# Patient Record
Sex: Male | Born: 1985 | Race: White | Hispanic: No | Marital: Single | Smoking: Former smoker
Health system: Southern US, Community
[De-identification: ages and names within clinical notes are randomized; demographics above are authoritative.]

## PROBLEM LIST (undated history)

## (undated) DIAGNOSIS — K219 Gastro-esophageal reflux disease without esophagitis: Secondary | ICD-10-CM

## (undated) DIAGNOSIS — J45909 Unspecified asthma, uncomplicated: Secondary | ICD-10-CM

## (undated) DIAGNOSIS — I10 Essential (primary) hypertension: Secondary | ICD-10-CM

## (undated) DIAGNOSIS — K921 Melena: Secondary | ICD-10-CM

## (undated) HISTORY — DX: Essential (primary) hypertension: I10

## (undated) HISTORY — DX: Gastro-esophageal reflux disease without esophagitis: K21.9

## (undated) HISTORY — DX: Unspecified asthma, uncomplicated: J45.909

## (undated) HISTORY — PX: TONSILLECTOMY AND ADENOIDECTOMY: SUR1326

## (undated) HISTORY — DX: Melena: K92.1

---

## 2005-06-10 ENCOUNTER — Emergency Department: Payer: Self-pay | Admitting: Emergency Medicine

## 2009-11-10 ENCOUNTER — Emergency Department (HOSPITAL_COMMUNITY): Admission: EM | Admit: 2009-11-10 | Discharge: 2009-11-10 | Payer: Self-pay | Admitting: Emergency Medicine

## 2010-05-09 LAB — COMPREHENSIVE METABOLIC PANEL
ALT: 21 U/L (ref 0–53)
Alkaline Phosphatase: 58 U/L (ref 39–117)
CO2: 26 mEq/L (ref 19–32)
Calcium: 9 mg/dL (ref 8.4–10.5)
Chloride: 106 mEq/L (ref 96–112)
GFR calc non Af Amer: >60 mL/min (ref >60)
Glucose, Bld: 100 mg/dL — ABNORMAL HIGH (ref 70–99)
Sodium: 138 mEq/L (ref 135–145)
Total Bilirubin: 0.7 mg/dL (ref 0.3–1.2)

## 2010-05-09 LAB — URINALYSIS, ROUTINE W REFLEX MICROSCOPIC
Ketones, ur: NEGATIVE mg/dL
Protein, ur: NEGATIVE mg/dL
Urobilinogen, UA: 0.2 mg/dL (ref 0.0–1.0)

## 2010-05-09 LAB — CBC
HCT: 43.9 % (ref 39.0–52.0)
Hemoglobin: 15.7 g/dL (ref 13.0–17.0)
MCHC: 35.8 g/dL (ref 30.0–36.0)
RBC: 5.01 MIL/uL (ref 4.22–5.81)

## 2010-05-09 LAB — CARBOXYHEMOGLOBIN: Total hemoglobin: 14.4 g/dL (ref 13.5–18.0)

## 2010-05-09 LAB — DIFFERENTIAL
Basophils Absolute: 0 10*3/uL (ref 0.0–0.1)
Basophils Relative: 1 % (ref 0–1)
Eosinophils Absolute: 0.1 10*3/uL (ref 0.0–0.7)
Lymphs Abs: 1.9 10*3/uL (ref 0.7–4.0)
Neutrophils Relative %: 54 % (ref 43–77)

## 2012-09-25 ENCOUNTER — Emergency Department: Payer: Self-pay | Admitting: Emergency Medicine

## 2012-09-25 LAB — COMPREHENSIVE METABOLIC PANEL
Alkaline Phosphatase: 67 U/L (ref 50–136)
EGFR (African American): >60
Glucose: 93 mg/dL (ref 65–99)
SGOT(AST): 23 U/L (ref 15–37)
SGPT (ALT): 33 U/L (ref 12–78)
Sodium: 139 mmol/L (ref 136–145)

## 2012-09-25 LAB — LIPASE, BLOOD: Lipase: 174 U/L (ref 73–393)

## 2012-09-25 LAB — URINALYSIS, COMPLETE
Nitrite: NEGATIVE
RBC,UR: 348 /HPF (ref 0–5)
Squamous Epithelial: NONE SEEN

## 2012-09-25 LAB — SEDIMENTATION RATE: Erythrocyte Sed Rate: 2 mm/hr (ref 0–15)

## 2013-05-04 ENCOUNTER — Encounter: Payer: Self-pay | Admitting: Family

## 2013-05-04 ENCOUNTER — Ambulatory Visit (INDEPENDENT_AMBULATORY_CARE_PROVIDER_SITE_OTHER): Payer: BC Managed Care – PPO | Admitting: Family

## 2013-05-04 VITALS — BP 122/80 | HR 69 | Ht 70.0 in | Wt 211.0 lb

## 2013-05-04 DIAGNOSIS — K920 Hematemesis: Secondary | ICD-10-CM

## 2013-05-04 DIAGNOSIS — K219 Gastro-esophageal reflux disease without esophagitis: Secondary | ICD-10-CM

## 2013-05-04 DIAGNOSIS — K625 Hemorrhage of anus and rectum: Secondary | ICD-10-CM

## 2013-05-04 LAB — CBC WITH DIFFERENTIAL/PLATELET
BASOS PCT: 0.4 % (ref 0.0–3.0)
Basophils Absolute: 0 10*3/uL (ref 0.0–0.1)
EOS ABS: 0.5 10*3/uL (ref 0.0–0.7)
Eosinophils Relative: 6 % — ABNORMAL HIGH (ref 0.0–5.0)
HCT: 44.2 % (ref 39.0–52.0)
Hemoglobin: 15.4 g/dL (ref 13.0–17.0)
LYMPHS PCT: 26.2 % (ref 12.0–46.0)
Lymphs Abs: 2 10*3/uL (ref 0.7–4.0)
MCHC: 34.8 g/dL (ref 30.0–36.0)
MCV: 90.5 fl (ref 78.0–100.0)
MONOS PCT: 8.3 % (ref 3.0–12.0)
Monocytes Absolute: 0.6 10*3/uL (ref 0.1–1.0)
NEUTROS PCT: 59.1 % (ref 43.0–77.0)
Neutro Abs: 4.6 10*3/uL (ref 1.4–7.7)
Platelets: 223 10*3/uL (ref 150.0–400.0)
RBC: 4.89 Mil/uL (ref 4.22–5.81)
RDW: 12.9 % (ref 11.5–14.6)
WBC: 7.8 10*3/uL (ref 4.5–10.5)

## 2013-05-04 LAB — COMPREHENSIVE METABOLIC PANEL
ALBUMIN: 4.4 g/dL (ref 3.5–5.2)
ALT: 33 U/L (ref 0–53)
AST: 21 U/L (ref 0–37)
Alkaline Phosphatase: 54 U/L (ref 39–117)
BUN: 13 mg/dL (ref 6–23)
CALCIUM: 9.3 mg/dL (ref 8.4–10.5)
CHLORIDE: 104 meq/L (ref 96–112)
CO2: 28 mEq/L (ref 19–32)
Creatinine, Ser: 1.1 mg/dL (ref 0.4–1.5)
GFR: 81.66 mL/min (ref >60.00)
Glucose, Bld: 110 mg/dL — ABNORMAL HIGH (ref 70–99)
POTASSIUM: 3.8 meq/L (ref 3.5–5.1)
Sodium: 138 mEq/L (ref 135–145)
Total Bilirubin: 0.7 mg/dL (ref 0.3–1.2)
Total Protein: 7 g/dL (ref 6.0–8.3)

## 2013-05-04 LAB — H. PYLORI ANTIBODY, IGG: H Pylori IgG: NEGATIVE

## 2013-05-04 MED ORDER — ESOMEPRAZOLE MAGNESIUM 40 MG PO CPDR: 40.0000 mg | DELAYED_RELEASE_CAPSULE | Freq: Every day | ORAL | Status: AC

## 2013-05-04 NOTE — Progress Notes (Signed)
   Subjective:    Patient ID: Colton Soto, male    DOB: 1985-05-19, 28 y.o.   MRN: [id number]  HPI 28 year old white male, nonsmoker, is in today with complaints of dark black blood in his stools and vomiting blood and 3-4 times per week over the last 3 years. Reports a significant family history of GERD as well as personal history. He has taken Prilosec over-the-counter and has not helped his symptoms. Reports heartburn and indigestion but denies any abdominal pain. Denies any family history of any colon cancer.   Review of Systems  Constitutional: Negative.   HENT: Negative.   Respiratory: Negative.   Cardiovascular: Negative.   Gastrointestinal: Positive for nausea, vomiting and blood in stool.  Endocrine: Negative.   Genitourinary: Negative.   Musculoskeletal: Negative.   Skin: Negative.   Neurological: Negative.   Psychiatric/Behavioral: Negative.    Past Medical History  Diagnosis Date  . Asthma   . GERD (gastroesophageal reflux disease)   . Hypertension   . Blood in stool     History   Social History  . Marital Status: Single    Spouse Name: N/A    Number of Children: N/A  . Years of Education: N/A   Occupational History  . Not on file.   Social History Main Topics  . Smoking status: Former Smoker    Quit date: 02/03/2013  . Smokeless tobacco: Not on file  . Alcohol Use: Yes  . Drug Use: Yes    Special: Marijuana  . Sexual Activity: Not on file   Other Topics Concern  . Not on file   Social History Narrative  . No narrative on file    Past Surgical History  Procedure Laterality Date  . Tonsillectomy and adenoidectomy      Family History  Problem Relation Age of Onset  . Hypertension Mother   . Hyperlipidemia Father   . Hypertension Father   . Lung cancer Paternal Grandmother     Allergies  Allergen Reactions  . Codeine     No current outpatient prescriptions on file prior to visit.   No current facility-administered medications on  file prior to visit.    BP 122/80  Pulse 69  Ht 5\' 10"  (1.778 m)  Wt 211 lb (95.709 kg)  BMI 30.28 kg/m2chart    Objective:   Physical Exam  Constitutional: He is oriented to person, place, and time. He appears well-developed and well-nourished.  HENT:  Right Ear: External ear normal.  Left Ear: External ear normal.  Nose: Nose normal.  Mouth/Throat: Oropharynx is clear and moist.  Neck: Normal range of motion. Neck supple.  Cardiovascular: Normal rate, regular rhythm and normal heart sounds.   Pulmonary/Chest: Effort normal and breath sounds normal.  Abdominal: Soft. Bowel sounds are normal.  Genitourinary: Guaiac negative stool.  Neurological: He is alert and oriented to person, place, and time.  Skin: Skin is warm and dry.  Psychiatric: He has a normal mood and affect.          Assessment & Plan:  Ivin BootyJoshua was seen today for establish care, rectal bleeding and emesis.  Diagnoses and associated orders for this visit:  Rectal bleeding - CBC with Differential - CMP - H. pylori Antibody, IgG - Ambulatory referral to Gastroenterology  Hematemesis - CBC with Differential - CMP - H. pylori Antibody, IgG - Ambulatory referral to Gastroenterology  Other Orders - esomeprazole (NEXIUM) 40 MG capsule; Take 1 capsule (40 mg total) by mouth daily.

## 2013-05-04 NOTE — Patient Instructions (Signed)
Rectal Bleeding Rectal bleeding is when blood passes out of the anus. It is usually a sign that something is wrong. It may not be serious, but it should always be evaluated. Rectal bleeding may present as bright red blood or extremely dark stools. The color may range from dark red or maroon to black (like tar). It is important that the cause of rectal bleeding be identified so treatment can be started and the problem corrected. CAUSES   Hemorrhoids. These are enlarged (dilated) blood vessels or veins in the anal or rectal area.  Fistulas. Theseare abnormal, burrowing channels that usually run from inside the rectum to the skin around the anus. They can bleed.  Anal fissures. This is a tear in the tissue of the anus. Bleeding occurs with bowel movements.  Diverticulosis. This is a condition in which pockets or sacs project from the bowel wall. Occasionally, the sacs can bleed.  Diverticulitis. Thisis an infection involving diverticulosis of the colon.  Proctitis and colitis. These are conditions in which the rectum, colon, or both, can become inflamed and pitted (ulcerated).  Polyps and cancer. Polyps are non-cancerous (benign) growths in the colon that may bleed. Certain types of polyps turn into cancer.  Protrusion of the rectum. Part of the rectum can project from the anus and bleed.  Certain medicines.  Intestinal infections.  Blood vessel abnormalities. HOME CARE INSTRUCTIONS  Eat a high-fiber diet to keep your stool soft.  Limit activity.  Drink enough fluids to keep your urine clear or pale yellow.  Warm baths may be useful to soothe rectal pain.  Follow up with your caregiver as directed. SEEK IMMEDIATE MEDICAL CARE IF:  You develop increased bleeding.  You have black or dark red stools.  You vomit blood or material that looks like coffee grounds.  You have abdominal pain or tenderness.  You have a fever.  You feel weak, nauseous, or you faint.  You have  severe rectal pain or you are unable to have a bowel movement. MAKE SURE YOU:  Understand these instructions.  Will watch your condition.  Will get help right away if you are not doing well or get worse. Document Released: 08/02/2001 Document Revised: 05/05/2011 Document Reviewed: 07/28/2010 [facility] Patient Information 2014 [place], [place].   Diet for Gastroesophageal Reflux Disease, Adult Reflux (acid reflux) is when acid from your stomach flows up into the esophagus. When acid comes in contact with the esophagus, the acid causes irritation and soreness (inflammation) in the esophagus. When reflux happens often or so severely that it causes damage to the esophagus, it is called gastroesophageal reflux disease (GERD). Nutrition therapy can help ease the discomfort of GERD. FOODS OR DRINKS TO AVOID OR LIMIT  Smoking or chewing tobacco. Nicotine is one of the most potent stimulants to acid production in the gastrointestinal tract.  Caffeinated and decaffeinated coffee and black tea.  Regular or low-calorie carbonated beverages or energy drinks (caffeine-free carbonated beverages are allowed).   Strong spices, such as black pepper, white pepper, red pepper, cayenne, curry powder, and chili powder.  Peppermint or spearmint.  Chocolate.  High-fat foods, including meats and fried foods. Extra added fats including oils, butter, salad dressings, and nuts. Limit these to less than 8 tsp per day.  Fruits and vegetables if they are not tolerated, such as citrus fruits or tomatoes.  Alcohol.  Any food that seems to aggravate your condition. If you have questions regarding your diet, call your caregiver or a registered dietitian. OTHER THINGS THAT  MAY HELP GERD INCLUDE:   Eating your meals slowly, in a relaxed setting.  Eating 5 to 6 small meals per day instead of 3 large meals.  Eliminating food for a period of time if it causes distress.  Not lying down until 3 hours after eating a  meal.  Keeping the head of your bed raised 6 to 9 inches (15 to 23 cm) by using a foam wedge or blocks under the legs of the bed. Lying flat may make symptoms worse.  Being physically active. Weight loss may be helpful in reducing reflux in overweight or obese adults.  Wear loose fitting clothing EXAMPLE MEAL PLAN This meal plan is approximately 2,000 calories based on https://www.bernard.org/ meal planning guidelines. Breakfast   cup cooked oatmeal.  1 cup strawberries.  1 cup low-fat milk.  1 oz almonds. Snack  1 cup cucumber slices.  6 oz yogurt (made from low-fat or fat-free milk). Lunch  2 slice whole-wheat bread.  2 oz sliced [place].  2 tsp mayonnaise.  1 cup blueberries.  1 cup snap peas. Snack  6 whole-wheat crackers.  1 oz string cheese. Dinner   cup brown rice.  1 cup mixed veggies.  1 tsp olive oil.  3 oz grilled fish. Document Released: 02/10/2005 Document Revised: 05/05/2011 Document Reviewed: 12/27/2010 [facility] Patient Information 2014 [place], [place].

## 2013-05-04 NOTE — Progress Notes (Signed)
Pre visit review using our clinic review tool, if applicable. No additional management support is needed unless otherwise documented below in the visit note. 

## 2013-06-06 ENCOUNTER — Encounter: Payer: Self-pay | Admitting: Gastroenterology

## 2013-06-11 ENCOUNTER — Emergency Department (HOSPITAL_COMMUNITY)
Admission: EM | Admit: 2013-06-11 | Discharge: 2013-06-11 | Disposition: A | Discharge status: Home / Self Care | Payer: BC Managed Care – PPO | Attending: Emergency Medicine | Admitting: Emergency Medicine

## 2013-06-11 ENCOUNTER — Encounter (HOSPITAL_COMMUNITY): Payer: Self-pay | Admitting: Emergency Medicine

## 2013-06-11 DIAGNOSIS — Z87891 Personal history of nicotine dependence: Secondary | ICD-10-CM | POA: Insufficient documentation

## 2013-06-11 DIAGNOSIS — Y929 Unspecified place or not applicable: Secondary | ICD-10-CM | POA: Insufficient documentation

## 2013-06-11 DIAGNOSIS — S01501A Unspecified open wound of lip, initial encounter: Secondary | ICD-10-CM | POA: Insufficient documentation

## 2013-06-11 DIAGNOSIS — J45909 Unspecified asthma, uncomplicated: Secondary | ICD-10-CM | POA: Insufficient documentation

## 2013-06-11 DIAGNOSIS — S01511A Laceration without foreign body of lip, initial encounter: Secondary | ICD-10-CM

## 2013-06-11 DIAGNOSIS — Z79899 Other long term (current) drug therapy: Secondary | ICD-10-CM | POA: Insufficient documentation

## 2013-06-11 DIAGNOSIS — K219 Gastro-esophageal reflux disease without esophagitis: Secondary | ICD-10-CM | POA: Insufficient documentation

## 2013-06-11 DIAGNOSIS — IMO0002 Reserved for concepts with insufficient information to code with codable children: Secondary | ICD-10-CM | POA: Insufficient documentation

## 2013-06-11 DIAGNOSIS — Y9389 Activity, other specified: Secondary | ICD-10-CM | POA: Insufficient documentation

## 2013-06-11 DIAGNOSIS — I10 Essential (primary) hypertension: Secondary | ICD-10-CM | POA: Insufficient documentation

## 2013-06-11 NOTE — ED Provider Notes (Signed)
CSN: [id number]     Arrival date & time 06/11/13  2034 History  This chart was scribed for non-physician practitioner Antony MaduraKelly Mabeline Varas, PA-C working with Shelda JakesScott W. Zackowski, MD by Joaquin MusicKristina Sanchez-Matthews, ED Scribe. This patient was seen in room WTR8/WTR8 and the patient's care was started at 10:34 PM .    Chief Complaint  Patient presents with  . Lip Laceration   Patient is a 28 y.o. male presenting with skin laceration. The history is provided by the patient. No language interpreter was used.  Laceration Location:  Mouth Mouth laceration location:  Upper outer lip Length (cm):  1.5 Depth:  Through dermis Quality: jagged   Bleeding: arterial   Injury mechanism: Wooden 2 x 4. Pain details:    Quality:  Dull   Severity:  No pain   Progression:  Unchanged Foreign body present:  No foreign bodies Relieved by:  Nothing Worsened by:  Nothing tried Ineffective treatments: Neosporin. Tetanus status:  Up to date  HPI Comments: Colton Soto is a 28 y.o. male who presents to the Emergency Department complaining of laceration to L upper lip that occurred 3 hours ago. Pt states he was hit by a 2 x 4 wood while unloading a truck. He denies having pain at this moment but states when moving or touching lip, pain worsens. He states his last tetanus shot was 2 years ago. Pts family member reports applying Neosporin to area without relief. Pt denies LOC, problems swallowing or loose teeth.  Past Medical History  Diagnosis Date  . Asthma   . GERD (gastroesophageal reflux disease)   . Hypertension   . Blood in stool    Past Surgical History  Procedure Laterality Date  . Tonsillectomy and adenoidectomy     Family History  Problem Relation Age of Onset  . Hypertension Mother   . Hyperlipidemia Father   . Hypertension Father   . Lung cancer Paternal Grandmother    History  Substance Use Topics  . Smoking status: Former Smoker    Quit date: 02/03/2013  . Smokeless tobacco: Not on file  .  Alcohol Use: Yes     Comment: occ    Review of Systems  HENT: Negative for dental problem and trouble swallowing.   Skin: Positive for wound.  Neurological: Negative for syncope.  All other systems reviewed and are negative.  Allergies  Codeine  Home Medications   Prior to Admission medications   Medication Sig Start Date End Date Taking? Authorizing Provider  esomeprazole (NEXIUM) 40 MG capsule Take 1 capsule (40 mg total) by mouth daily. 05/04/13   Baker PieriniPadonda B Campbell, FNP   BP 136/96  Pulse 93  Temp(Src) 99.2 F (37.3 C) (Oral)  Resp 16  Ht 5\' 11"  (1.803 m)  Wt 215 lb (97.523 kg)  BMI 30.00 kg/m2  SpO2 98%  Physical Exam  Nursing note and vitals reviewed. Constitutional: He is oriented to person, place, and time. He appears well-developed and well-nourished. No distress.  HENT:  Head: Normocephalic and atraumatic.  Mouth/Throat: Oropharynx is clear and moist. No oropharyngeal exudate.  1.5 cm laceration to upper outer lip; jagged with part paralleling/partially including vermilion border. 0.5cm laceration to L upper inner lip. Bleeding controlled. No loose dentition or evidence of dental trauma. Oropharynx clear. Patient tolerating secretions without difficulty or drooling.  Eyes: Conjunctivae and EOM are normal. No scleral icterus.  Neck: Normal range of motion.  Pulmonary/Chest: Effort normal. No respiratory distress.  Musculoskeletal: Normal range of motion.  Neurological: He  is alert and oriented to person, place, and time.  Skin: Skin is warm and dry. No rash noted. He is not diaphoretic. No erythema. No pallor.  Psychiatric: He has a normal mood and affect. His behavior is normal.    ED Course  Procedures  DIAGNOSTIC STUDIES: Oxygen Saturation is 98% on RA, normal by my interpretation.    COORDINATION OF CARE: 10:36 PM-Discussed treatment plan which includes laceration repair. Pt agreed to plan.   10:40 PM-LACERATION REPAIR Performed by: Antony MaduraKelly Minahil Quinlivan,  PA-C Consent: Verbal consent obtained. Risks and benefits: risks, benefits and alternatives were discussed Patient identity confirmed: provided demographic data Time out performed prior to procedure Prepped and Draped in normal sterile fashion Wound explored Laceration Location: L upper outer lip Laceration Length: 1.5 cm No Foreign Bodies seen or palpated Anesthesia: local infiltration Local anesthetic: lidocaine 2% without epinephrine Anesthetic total: 1.5 ml Irrigation method: syringe Amount of cleaning: standard Skin closure: 6.0 Prolene Number of sutures: 4 Technique: Simple interrupted  Patient tolerance: Patient tolerated the procedure well with no immediate complications.  10:56 PM- LACERATION REPAIR Performed by: Antony MaduraKelly Tenzin Pavon, PA-C Consent: Verbal consent obtained. Risks and benefits: risks, benefits and alternatives were discussed Patient identity confirmed: provided demographic data Time out performed prior to procedure Prepped and Draped in normal sterile fashion Wound explored Laceration Location: L upper inner lip Laceration Length: .5 cm No Foreign Bodies seen or palpated Anesthesia: local infiltration Local anesthetic: lidocaine 2% without epinephrine Anesthetic total: 0 ml Irrigation method: syringe Amount of cleaning: standard Skin closure: 5.0 Chromic Number of sutures: 1 Technique: Simple interrupted  Patient tolerance: Patient tolerated the procedure well with no immediate complications.  11:00 PM-Advised pt to have suture removed in 5-7 days by PCP and to keep area clean. Informed pt to apply ice to area to reduce swelling.  Labs Review Labs Reviewed - No data to display  Imaging Review No results found.   EKG Interpretation None     MDM   Final diagnoses:  Laceration of upper lip, complicated    Tdap UTD. Pressure irrigation performed. Laceration occurred < 8 hours prior to repair which was well tolerated. Pt has no comorbidities to  effect normal wound healing. Discussed suture home care with patient. Pt to follow for wound check and suture removal in 5-7 days. Pt is hemodynamically stable with no complaints prior to d/c. Return precautions provided and patient agreeable to plan with no unaddressed concerns.  I personally performed the services described in this documentation, which was scribed in my presence. The recorded information has been reviewed and is accurate.   Filed Vitals:   06/11/13 2120  BP: 136/96  Pulse: 93  Temp: 99.2 F (37.3 C)  TempSrc: Oral  Resp: 16  Height: 5\' 11"  (1.803 m)  Weight: 215 lb (97.523 kg)  SpO2: 98%     Antony MaduraKelly Danisha Brassfield, PA-C 06/11/13 2338

## 2013-06-11 NOTE — Discharge Instructions (Signed)
Swish and spit with water after each meal to prevent this from seeding in your laceration. Have your sutures removed in 5-7 days. Apply ice to limit swelling. Return if symptoms worsen or signs of infection develop.  Open Wound, Lip An open wound is a break in the skin caused by an injury. An open wound to the lip can be a scrape, cut, or hole (puncture). Good wound care will help:   Lessen pain.  Prevent infection.  Lessen scarring. HOME CARE  Wash off all dirt.  Clean your wounds daily with gentle soap and water.  Eat soft foods or liquids while your wound is healing.  Rinse the wound with salt water after each meal.  Apply medicated cream after the wound has been cleaned as told by your doctor.  Follow up with your doctor as told. GET HELP RIGHT AWAY IF:   There is more redness or puffiness (swelling) in or around the wound.  There is increasing pain.  You have a temperature by mouth above 102 F (38.9 C), not controlled by medicine.  Your baby is older than 3 months with a rectal temperature of 102 F (38.9 C) or higher.  Your baby is 623 months old or younger with a rectal temperature of 100.4 F (38 C) or higher.  There is yellowish white fluid (pus) coming from the wound.  Very bad pain develops that is not controlled with medicine.  There is a red line on the skin above or below the wound. MAKE SURE YOU:   Understand these instructions.  Will watch this condition.  Will get help right away if you are not doing well or get worse. Document Released: 05/09/2008 Document Revised: 05/05/2011 Document Reviewed: 05/29/2009 [facility] Patient Information 2014 [place], [place].  Facial Laceration  A facial laceration is a cut on the face. These injuries can be painful and cause bleeding. Lacerations usually heal quickly, but they need special care to reduce scarring. DIAGNOSIS  Your health care provider will take a medical history, ask for details about how the  injury occurred, and examine the wound to determine how deep the cut is. TREATMENT  Some facial lacerations may not require closure. Others may not be able to be closed because of an increased risk of infection. The risk of infection and the chance for successful closure will depend on various factors, including the amount of time since the injury occurred. The wound may be cleaned to help prevent infection. If closure is appropriate, pain medicines may be given if needed. Your health care provider will use stitches (sutures), wound glue (adhesive), or skin adhesive strips to repair the laceration. These tools bring the skin edges together to allow for faster healing and a better cosmetic outcome. If needed, you may also be given a tetanus shot. HOME CARE INSTRUCTIONS  Only take over-the-counter or prescription medicines as directed by your health care provider.  Follow your health care provider's instructions for wound care. These instructions will vary depending on the technique used for closing the wound. For Sutures:  Keep the wound clean and dry.   If you were given a bandage (dressing), you should change it at least once a day. Also change the dressing if it becomes wet or dirty, or as directed by your health care provider.   Wash the wound with soap and water 2 times a day. Rinse the wound off with water to remove all soap. Pat the wound dry with a clean towel.   After cleaning,  apply a thin layer of the antibiotic ointment recommended by your health care provider. This will help prevent infection and keep the dressing from sticking.   You may shower as usual after the first 24 hours. Do not soak the wound in water until the sutures are removed.   Get your sutures removed as directed by your health care provider. With facial lacerations, sutures should usually be taken out after 4 5 days to avoid stitch marks.   Wait a few days after your sutures are removed before applying any  makeup. For Skin Adhesive Strips:  Keep the wound clean and dry.   Do not get the skin adhesive strips wet. You may bathe carefully, using caution to keep the wound dry.   If the wound gets wet, pat it dry with a clean towel.   Skin adhesive strips will fall off on their own. You may trim the strips as the wound heals. Do not remove skin adhesive strips that are still stuck to the wound. They will fall off in time.  For Wound Adhesive:  You may briefly wet your wound in the shower or bath. Do not soak or scrub the wound. Do not swim. Avoid periods of heavy sweating until the skin adhesive has fallen off on its own. After showering or bathing, gently pat the wound dry with a clean towel.   Do not apply liquid medicine, cream medicine, ointment medicine, or makeup to your wound while the skin adhesive is in place. This may loosen the film before your wound is healed.   If a dressing is placed over the wound, be careful not to apply tape directly over the skin adhesive. This may cause the adhesive to be pulled off before the wound is healed.   Avoid prolonged exposure to sunlight or tanning lamps while the skin adhesive is in place.  The skin adhesive will usually remain in place for 5 10 days, then naturally fall off the skin. Do not pick at the adhesive film.  After Healing: Once the wound has healed, cover the wound with sunscreen during the day for 1 full year. This can help minimize scarring. Exposure to ultraviolet light in the first year will darken the scar. It can take 1 2 years for the scar to lose its redness and to heal completely.  SEEK IMMEDIATE MEDICAL CARE IF:  You have redness, pain, or swelling around the wound.   You see ayellowish-white fluid (pus) coming from the wound.   You have chills or a fever.  MAKE SURE YOU:  Understand these instructions.  Will watch your condition.  Will get help right away if you are not doing well or get worse. Document  Released: 03/20/2004 Document Revised: 12/01/2012 Document Reviewed: 09/23/2012 [facility] Patient Information 2014 [place]ExitCare, [place].

## 2013-06-11 NOTE — ED Notes (Addendum)
Pt states while unloading a truck of 2 x 4's, wood slipped and struck pt in mouth. Laceration noted to L upper lip, edges not approximated. Bleeding controlled, denies loose teeth, denies LOC

## 2013-06-12 NOTE — ED Provider Notes (Signed)
Medical screening examination/treatment/procedure(s) were performed by non-physician practitioner and as supervising physician I was immediately available for consultation/collaboration.   EKG Interpretation None        Colton JakesScott W. Jaxden Blyden, MD 06/12/13 [phone]

## 2013-07-26 ENCOUNTER — Ambulatory Visit: Payer: BC Managed Care – PPO | Admitting: Gastroenterology

## 2014-11-04 IMAGING — CT CT ABD-PELV W/ CM
1 of 2 series · 15 of 32 positions shown, 19 images · non-contrast
Comparison: none

REASON FOR EXAM: (1) RLQ pain; (2) RLQ pain;    NOTE: Nursing to Give
Oral CT Contrast
COMMENTS:   May transport without cardiac monitor

PROCEDURE:     CT  - CT ABDOMEN / PELVIS  W  - September 25, 2012  [DATE]
RESULT:     CT abdomen and pelvis dated 09/25/2012
TECHNIQUE: Helical 3 mm sections were obtained from the lung bases through
the pubic symphysis status post intravenous administration of 100 mL of
[id number] and oral contrast.

[Series 2: 3mm soft tissue · axial · 0.77mm/px · z∈[-590,-164]mm · 15 of 156 slices shown, 19 images]
[im 7/156  soft-tissue]
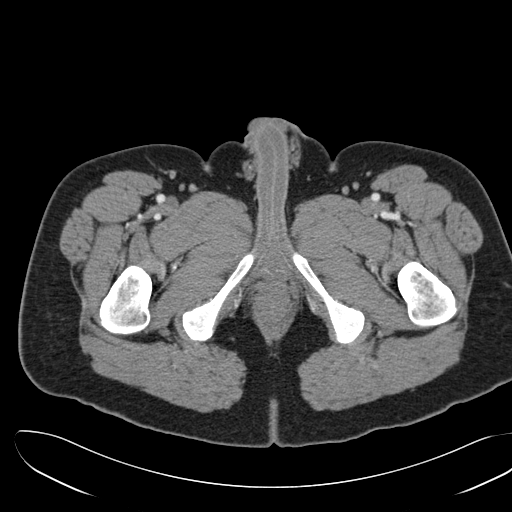
[im 7/156  bone]
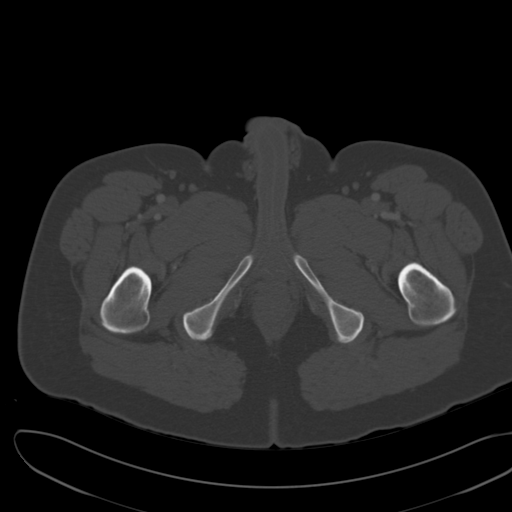
[im 21/156  soft-tissue]
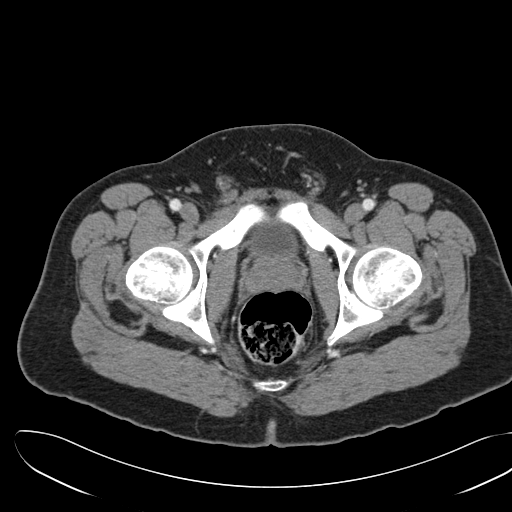
[im 34/156  soft-tissue]
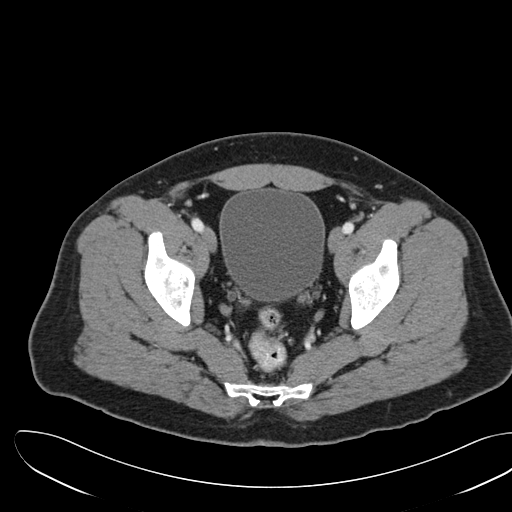
[im 41/156  soft-tissue]
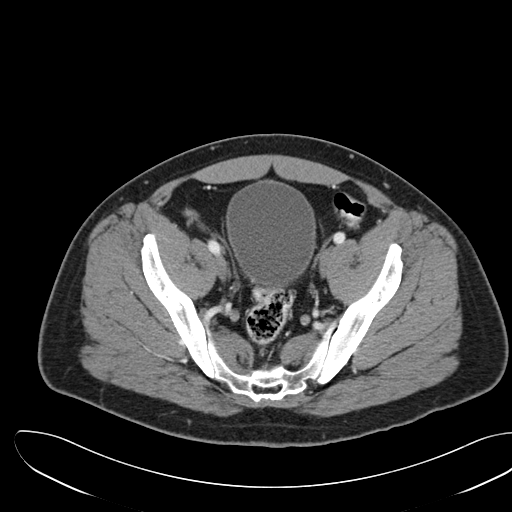
[im 54/156  soft-tissue]
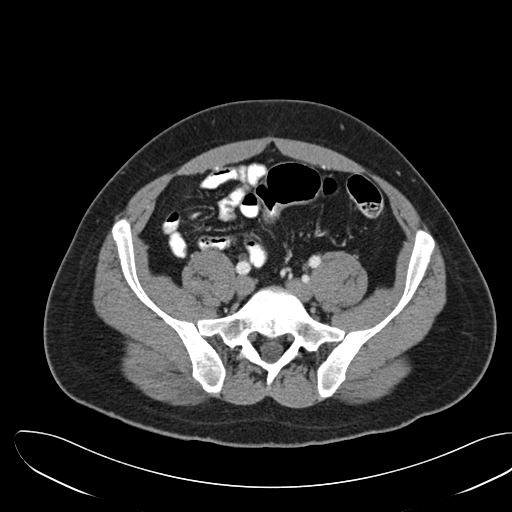
[im 68/156  soft-tissue]
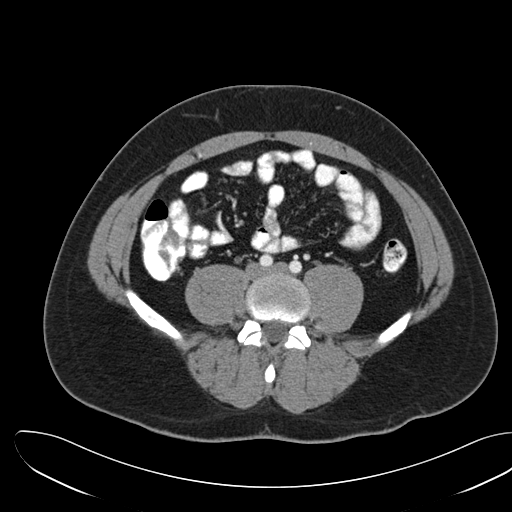
[im 81/156  soft-tissue]
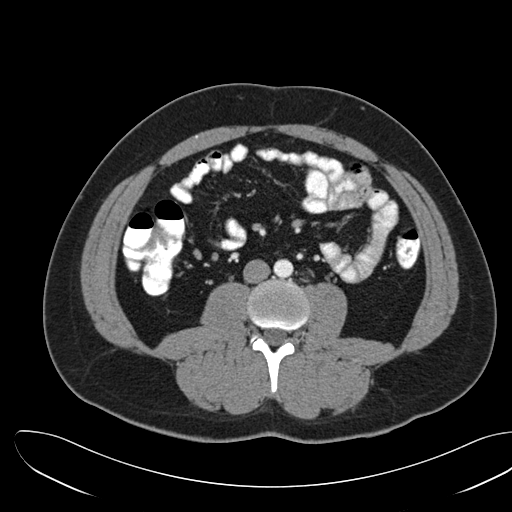
[im 88/156  soft-tissue]
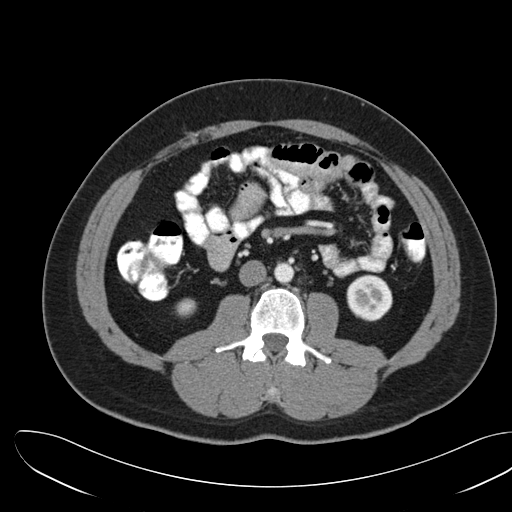
[im 102/156  soft-tissue]
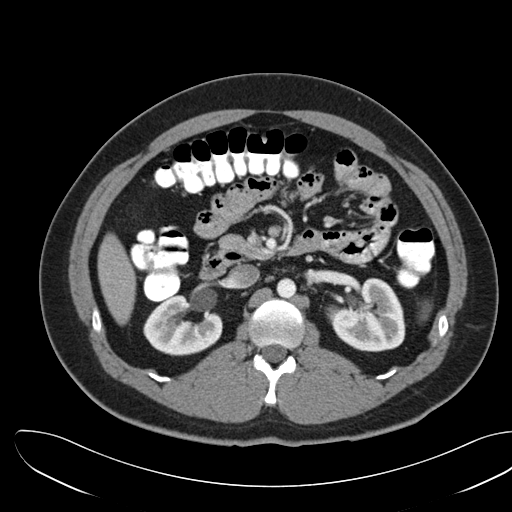
[im 102/156  bone]
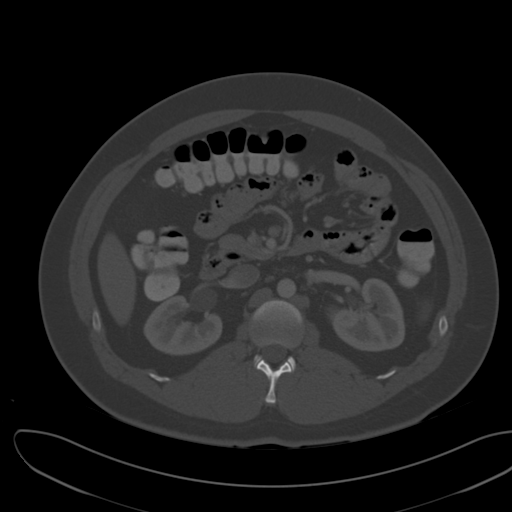
[im 115/156  soft-tissue]
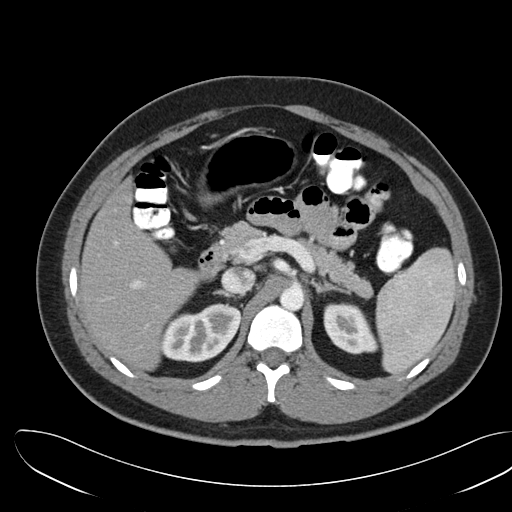
[im 122/156  soft-tissue]
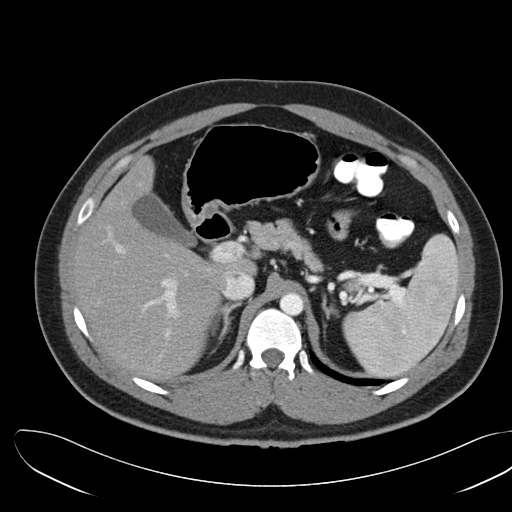
[im 129/156  lung]
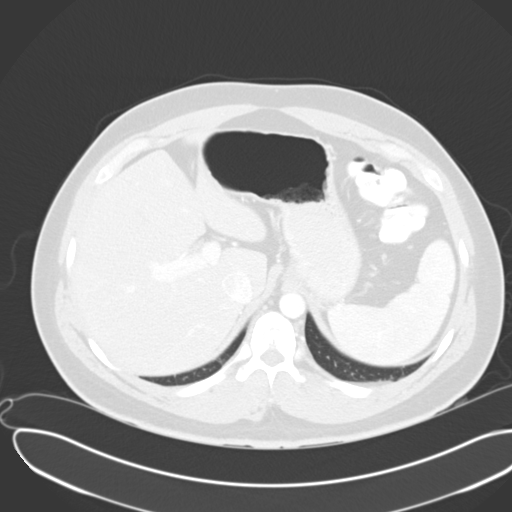
[im 135/156  soft-tissue]
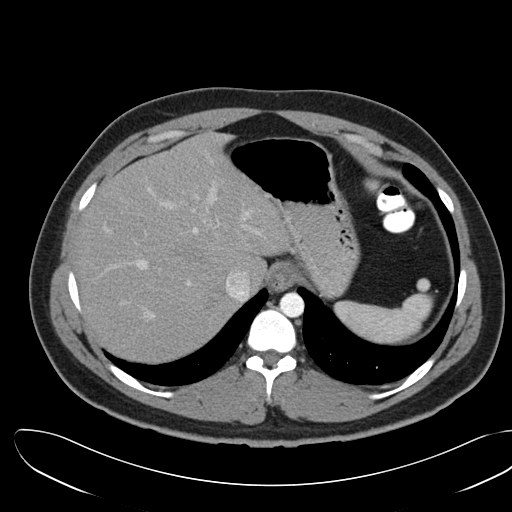
[im 135/156  lung]
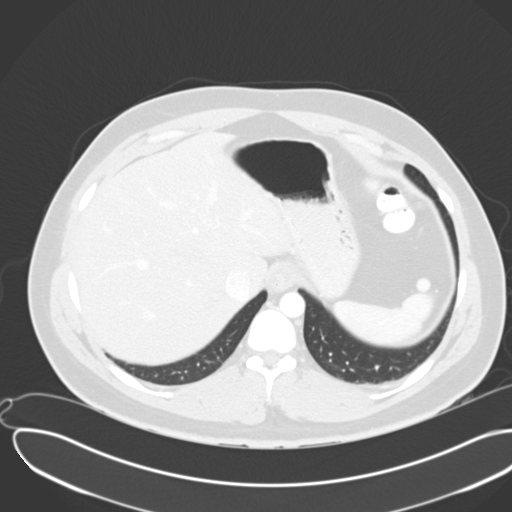
[im 142/156  lung]
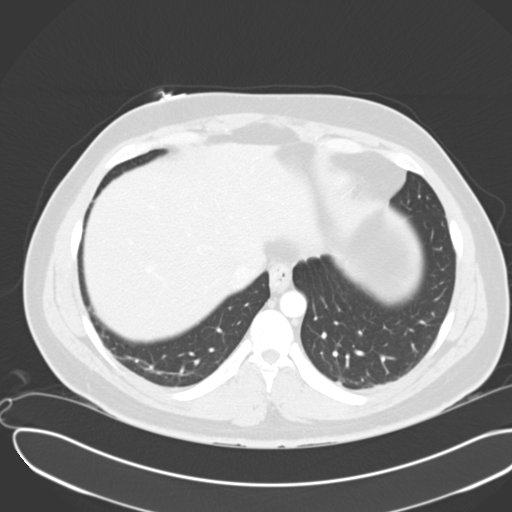
[im 149/156  soft-tissue]
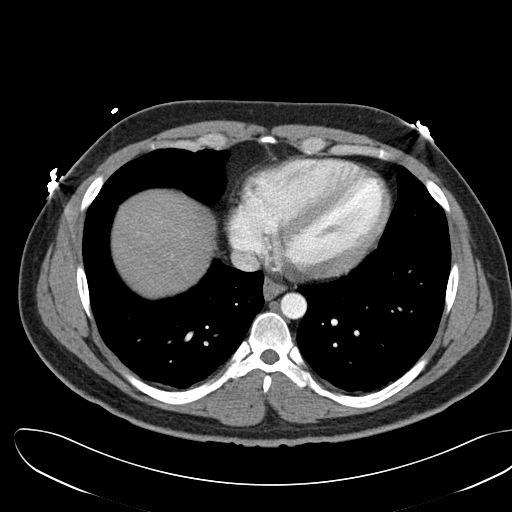
[im 149/156  lung]
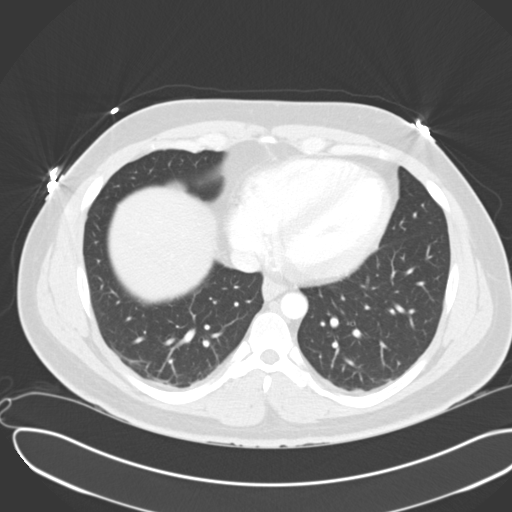

[15 of 32 positions shown; findings below may reference images not displayed]

FINDINGS: Mild hypoventilation is identified within the lung bases.

The liver, spleen, adrenals, pancreas, left kidney are unremarkable.

Mild pelviectasis is identified within the right kidney as well as minimal
hydroureter. Within the distal right ureter at the level of the
vesicoureteral junction a 4.3 mm calculus is identified. The right kidney is
otherwise unremarkable.

There is no evidence of bowel obstruction, enteritis, colitis,
diverticulitis common nor appendicitis. There is no evidence of abdominal
aortic aneurysm. The celiac, SMA, IMA, portal vein, SMV are opacified.
IMPRESSION: Distal right vesicoureteral junction calculus with
associated minimal obstructive uropathy.

## 2016-04-03 ENCOUNTER — Encounter (HOSPITAL_COMMUNITY): Payer: Self-pay | Admitting: Emergency Medicine

## 2016-04-03 ENCOUNTER — Emergency Department (HOSPITAL_COMMUNITY)
Admission: EM | Admit: 2016-04-03 | Discharge: 2016-04-03 | Disposition: A | Discharge status: Home / Self Care | Payer: Self-pay | Attending: Pediatric Emergency Medicine | Admitting: Pediatric Emergency Medicine

## 2016-04-03 DIAGNOSIS — J45909 Unspecified asthma, uncomplicated: Secondary | ICD-10-CM | POA: Insufficient documentation

## 2016-04-03 DIAGNOSIS — I10 Essential (primary) hypertension: Secondary | ICD-10-CM | POA: Insufficient documentation

## 2016-04-03 DIAGNOSIS — Z87891 Personal history of nicotine dependence: Secondary | ICD-10-CM | POA: Insufficient documentation

## 2016-04-03 DIAGNOSIS — Z79899 Other long term (current) drug therapy: Secondary | ICD-10-CM | POA: Insufficient documentation

## 2016-04-03 DIAGNOSIS — J111 Influenza due to unidentified influenza virus with other respiratory manifestations: Secondary | ICD-10-CM | POA: Insufficient documentation

## 2016-04-03 DIAGNOSIS — R69 Illness, unspecified: Secondary | ICD-10-CM

## 2016-04-03 MED ORDER — OSELTAMIVIR PHOSPHATE 75 MG PO CAPS: 75.0000 mg | ORAL_CAPSULE | Freq: Two times a day (BID) | ORAL | 0 refills | Status: AC

## 2016-04-03 MED ORDER — IBUPROFEN 400 MG PO TABS
800.0000 mg | ORAL_TABLET | Freq: Once | ORAL | Status: AC
  Administered 2016-04-03: 800 mg via ORAL
  Filled 2016-04-03: qty 2

## 2016-04-03 NOTE — ED Triage Notes (Signed)
Pt sts fever and body aches; pt here with son

## 2016-04-03 NOTE — ED Provider Notes (Signed)
[facility]-EMERGENCY DEPT Provider Note   CSN: [id number] Arrival date & time: 04/03/16  1155     History   Chief Complaint Chief Complaint  Patient presents with  . Influenza    HPI Colton Soto is a 31 y.o. male.  The history is provided by the patient. No language interpreter was used.  Influenza  Presenting symptoms: cough, fever, headache, nausea, rhinorrhea and sore throat   Severity:  Moderate Onset quality:  Gradual Duration:  2 days Progression:  Unchanged Chronicity:  New Relieved by:  None tried Worsened by:  Nothing Ineffective treatments:  None tried Associated symptoms: no neck stiffness   Risk factors: sick contacts   Risk factors: not elderly, no diabetes problem, no heart disease, no immunocompromised state, no kidney disease and no liver disease  Pregnant now: roomates with similar symptoms.     Past Medical History:  Diagnosis Date  . Asthma   . Blood in stool   . GERD (gastroesophageal reflux disease)   . Hypertension     Patient Active Problem List   Diagnosis Date Noted  . GERD (gastroesophageal reflux disease) 05/04/2013    Past Surgical History:  Procedure Laterality Date  . TONSILLECTOMY AND ADENOIDECTOMY         Home Medications    Prior to Admission medications   Medication Sig Start Date End Date Taking? Authorizing Provider  esomeprazole (NEXIUM) 40 MG capsule Take 1 capsule (40 mg total) by mouth daily. 05/04/13   Eulis Foster, FNP  oseltamivir (TAMIFLU) 75 MG capsule Take 1 capsule (75 mg total) by mouth every 12 (twelve) hours. 04/03/16   Sharene Skeans, MD    Family History Family History  Problem Relation Age of Onset  . Hypertension Mother   . Hyperlipidemia Father   . Hypertension Father   . Lung cancer Paternal Grandmother     Social History Social History  Substance Use Topics  . Smoking status: Former Smoker    Quit date: 02/03/2013  . Smokeless tobacco: Not on file  . Alcohol use Yes     Comment: occ      Allergies   Codeine   Review of Systems Review of Systems  Constitutional: Positive for fever.  HENT: Positive for rhinorrhea and sore throat.   Respiratory: Positive for cough.   Gastrointestinal: Positive for nausea.  Musculoskeletal: Negative for neck stiffness.  Neurological: Positive for headaches.  All other systems reviewed and are negative.    Physical Exam Updated Vital Signs BP 112/85 (BP Location: Left Arm)   Pulse 84   Temp 98.3 F (36.8 C) (Oral)   Resp 16   Ht 5\' 11"  (1.803 m)   SpO2 98%   Physical Exam  Constitutional: He is oriented to person, place, and time. He appears well-developed and well-nourished.  HENT:  Head: Normocephalic and atraumatic.  Mouth/Throat: Oropharynx is clear and moist.  Eyes: Conjunctivae are normal. Pupils are equal, round, and reactive to light.  Neck: Normal range of motion. Neck supple. No thyromegaly present.  Cardiovascular: Normal rate, regular rhythm, normal heart sounds and intact distal pulses.  Exam reveals no gallop and no friction rub.   No murmur heard. Pulmonary/Chest: Effort normal and breath sounds normal. No respiratory distress. He has no wheezes. He has no rales. He exhibits no tenderness.  Abdominal: Soft. Bowel sounds are normal.  Musculoskeletal: Normal range of motion.  Lymphadenopathy:    He has no cervical adenopathy.  Neurological: He is alert and oriented to person, place, and  time.  Skin: Skin is warm and dry. Capillary refill takes less than 2 seconds.  Nursing note and vitals reviewed.    ED Treatments / Results  Labs (all labs ordered are listed, but only abnormal results are displayed) Labs Reviewed - No data to display  EKG  EKG Interpretation None       Radiology No results found.  Procedures Procedures (including critical care time)  Medications Ordered in ED Medications - No data to display   Initial Impression / Assessment and Plan / ED Course  I have reviewed  the triage vital signs and the nursing notes.  Pertinent labs & imaging results that were available during my care of the patient were reviewed by me and considered in my medical decision making (see chart for details).     31 y.o. with influenza like illness.  Patient strongly prefers tamiflu.  I discussed the risks and side effects of tamiflu.  Rx provided for the same.  Discussed specific signs and symptoms of concern for which they should return to ED.  Discharge with close follow up with primary care physician if no better in next 2 days.  Patient comfortable with this plan of care.   Final Clinical Impressions(s) / ED Diagnoses   Final diagnoses:  Influenza-like illness    New Prescriptions New Prescriptions   OSELTAMIVIR (TAMIFLU) 75 MG CAPSULE    Take 1 capsule (75 mg total) by mouth every 12 (twelve) hours.     Sharene SkeansShad Xolani Degracia, MD 04/03/16 1356
